# Patient Record
Sex: Male | Born: 1995 | Race: Black or African American | Hispanic: No | Marital: Single | Smoking: Never smoker
Health system: Southern US, Community
[De-identification: ages and names within clinical notes are randomized; demographics above are authoritative.]

## PROBLEM LIST (undated history)

## (undated) DIAGNOSIS — R32 Unspecified urinary incontinence: Secondary | ICD-10-CM

## (undated) DIAGNOSIS — J302 Other seasonal allergic rhinitis: Secondary | ICD-10-CM

## (undated) HISTORY — DX: Morbid (severe) obesity due to excess calories: E66.01

## (undated) HISTORY — DX: Unspecified urinary incontinence: R32

---
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
## 2019-01-28 ENCOUNTER — Emergency Department (HOSPITAL_COMMUNITY)
Admission: EM | Admit: 2019-01-28 | Discharge: 2019-01-28 | Disposition: A | Discharge status: Home / Self Care | Payer: 59 | Attending: Emergency Medicine | Admitting: Emergency Medicine

## 2019-01-28 ENCOUNTER — Encounter (HOSPITAL_COMMUNITY): Payer: Self-pay | Admitting: Emergency Medicine

## 2019-01-28 ENCOUNTER — Emergency Department (HOSPITAL_COMMUNITY): Payer: 59

## 2019-01-28 ENCOUNTER — Other Ambulatory Visit: Payer: Self-pay

## 2019-01-28 DIAGNOSIS — M791 Myalgia, unspecified site: Secondary | ICD-10-CM | POA: Insufficient documentation

## 2019-01-28 DIAGNOSIS — J029 Acute pharyngitis, unspecified: Secondary | ICD-10-CM | POA: Insufficient documentation

## 2019-01-28 DIAGNOSIS — Z20828 Contact with and (suspected) exposure to other viral communicable diseases: Secondary | ICD-10-CM | POA: Insufficient documentation

## 2019-01-28 DIAGNOSIS — R509 Fever, unspecified: Secondary | ICD-10-CM | POA: Diagnosis not present

## 2019-01-28 DIAGNOSIS — R05 Cough: Secondary | ICD-10-CM | POA: Diagnosis present

## 2019-01-28 DIAGNOSIS — Z20822 Contact with and (suspected) exposure to covid-19: Secondary | ICD-10-CM

## 2019-01-28 LAB — CBC WITH DIFFERENTIAL/PLATELET
Abs Immature Granulocytes: 0.02 10*3/uL (ref 0.00–0.07)
Basophils Absolute: 0 10*3/uL (ref 0.0–0.1)
Basophils Relative: 0 %
Eosinophils Absolute: 0.1 10*3/uL (ref 0.0–0.5)
Eosinophils Relative: 1 %
HCT: 43.7 % (ref 39.0–52.0)
Hemoglobin: 14.3 g/dL (ref 13.0–17.0)
Immature Granulocytes: 0 %
Lymphocytes Relative: 27 %
Lymphs Abs: 1.4 10*3/uL (ref 0.7–4.0)
MCH: 29.4 pg (ref 26.0–34.0)
MCHC: 32.7 g/dL (ref 30.0–36.0)
MCV: 89.9 fL (ref 80.0–100.0)
Monocytes Absolute: 0.6 10*3/uL (ref 0.1–1.0)
Monocytes Relative: 11 %
Neutro Abs: 3.3 10*3/uL (ref 1.7–7.7)
Neutrophils Relative %: 61 %
Platelets: 254 10*3/uL (ref 150–400)
RBC: 4.86 MIL/uL (ref 4.22–5.81)
RDW: 12.7 % (ref 11.5–15.5)
WBC: 5.4 10*3/uL (ref 4.0–10.5)
nRBC: 0 % (ref 0.0–0.2)

## 2019-01-28 LAB — BASIC METABOLIC PANEL
Anion gap: 9 (ref 5–15)
BUN: 13 mg/dL (ref 6–20)
CO2: 25 mmol/L (ref 22–32)
Calcium: 9.3 mg/dL (ref 8.9–10.3)
Chloride: 104 mmol/L (ref 98–111)
Creatinine, Ser: 0.87 mg/dL (ref 0.61–1.24)
GFR calc Af Amer: >60 mL/min (ref >60)
GFR calc non Af Amer: >60 mL/min (ref >60)
Glucose, Bld: 113 mg/dL — ABNORMAL HIGH (ref 70–99)
Potassium: 4.2 mmol/L (ref 3.5–5.1)
Sodium: 138 mmol/L (ref 135–145)

## 2019-01-28 MED ORDER — ACETAMINOPHEN 500 MG PO TABS
1000.0000 mg | ORAL_TABLET | Freq: Once | ORAL | Status: AC
  Administered 2019-01-28: 18:00:00 1000 mg via ORAL
  Filled 2019-01-28: qty 2

## 2019-01-28 NOTE — ED Notes (Signed)
Spoke with pt's mother on video chat and updated on plan of care

## 2019-01-28 NOTE — ED Notes (Signed)
Patient verbalizes understanding of discharge instructions. Opportunity for questioning and answers were provided. Armband removed by staff, pt discharged from ED ambulatory.   

## 2019-01-28 NOTE — Discharge Instructions (Addendum)
Self quarantine until your coronavirus test results are back.  If you test positive you need to quarantine for 14 days.  Please buy a pulse ox over-the-counter and check your oxygen.  If you have increased shortness of breath or your oxygen drops below 90% or if you have any concerns please return to the ER.  Take Tylenol as needed for fever body aches.  Drink plenty of fluids and rest.

## 2019-01-28 NOTE — ED Triage Notes (Signed)
Pt reports that he began having sneezing, cough, and fatigue that started Friday, and today pt developed a fever of 101 at home. He reports doing a virtual visit with a [place] provider who recommended testing. Pt in NAD at this time.

## 2019-01-28 NOTE — ED Provider Notes (Signed)
[facility] EMERGENCY DEPARTMENT Provider Note   CSN: [id number] Arrival date & time: 01/28/19  1613     History   Chief Complaint No chief complaint on file.   HPI Maanav Kassabian is a 23 y.o. male.     HPI   23 year old male, no significant medical history, presents with a 1 day history of cough, fever, body aches.  He notes a mild sore throat and rhinorrhea.  He denies any nausea, vomiting, abdominal pain.  He states a history of strep but states pain today does not feel the same as strep.  Patient denies any known sick contacts, exposure to coronavirus.  Past Medical History:  Diagnosis Date  . Enuresis   . Morbid obesity ([place])   . Seasonal allergies     Patient Active Problem List   Diagnosis Date Noted  . Morbid obesity ([place])     History reviewed. No pertinent surgical history.      Home Medications    Prior to Admission medications   Medication Sig Start Date End Date Taking? Authorizing Provider  amoxicillin (AMOXIL) 875 MG tablet Take 1 tablet (875 mg total) by mouth 2 (two) times daily. 05/24/15   Alycia Rossetti, MD  cetirizine (ZYRTEC) 10 MG tablet Take 10 mg by mouth daily.    [provider]  fluticasone (FLONASE) 50 MCG/ACT nasal spray Place 2 sprays into both nostrils daily. 05/24/15   Alycia Rossetti, MD    Family History Family History  Problem Relation Age of Onset  . Hypertension Mother   . Sleep apnea Mother   . Diabetes Maternal Grandmother   . Diabetes Maternal Grandfather     Social History Social History   Tobacco Use  . Smoking status: Never Smoker  . Smokeless tobacco: Never Used  Substance Use Topics  . Alcohol use: No    Alcohol/week: 0.0 standard drinks  . Drug use: No     Allergies   Patient has no known allergies.   Review of Systems Review of Systems  Constitutional: Positive for fever. Negative for chills.  HENT: Positive for rhinorrhea, sneezing and sore throat.   Respiratory:  Positive for cough. Negative for shortness of breath.   Cardiovascular: Negative for chest pain.  Gastrointestinal: Negative for abdominal pain, nausea and vomiting.     Physical Exam Updated Vital Signs BP 131/62 (BP Location: Left Arm)   Pulse (!) 125   Temp 100.3 F (37.9 C) (Oral)   Resp 16   SpO2 98%   Physical Exam Vitals signs and nursing note reviewed.  Constitutional:      Appearance: He is well-developed.  HENT:     Head: Normocephalic and atraumatic.     Mouth/Throat:     Lips: Pink.     Mouth: Mucous membranes are moist. No oral lesions.     Tongue: No lesions.     Palate: No mass.     Pharynx: Posterior oropharyngeal erythema (mild) present. No pharyngeal swelling or oropharyngeal exudate.  Eyes:     Conjunctiva/sclera: Conjunctivae normal.  Neck:     Musculoskeletal: Neck supple.  Cardiovascular:     Rate and Rhythm: Normal rate and regular rhythm.     Heart sounds: Normal heart sounds. No murmur.  Pulmonary:     Effort: Pulmonary effort is normal. No respiratory distress.     Breath sounds: Normal breath sounds. No wheezing or rales.  Abdominal:     General: Bowel sounds are normal. There is no distension.  Palpations: Abdomen is soft.     Tenderness: There is no abdominal tenderness.  Musculoskeletal: Normal range of motion.        General: No tenderness or deformity.  Skin:    General: Skin is warm and dry.     Findings: No erythema or rash.  Neurological:     Mental Status: He is alert and oriented to person, place, and time.  Psychiatric:        Behavior: Behavior normal.      ED Treatments / Results  Labs (all labs ordered are listed, but only abnormal results are displayed) Labs Reviewed  BASIC METABOLIC PANEL - Abnormal; Notable for the following components:      Result Value   Glucose, Bld 113 (*)    All other components within normal limits  NOVEL CORONAVIRUS, NAA (HOSPITAL ORDER, SEND-OUT TO REF LAB)  CULTURE, GROUP A STREP  ([facility])  CBC WITH DIFFERENTIAL/PLATELET    EKG None  Radiology Dg Chest Portable 1 View  Result Date: 01/28/2019 CLINICAL DATA:  Fever, chills and cough for 2 days. EXAM: PORTABLE CHEST 1 VIEW COMPARISON:  PA and lateral chest 07/06/2013. FINDINGS: Lungs clear. Heart size normal. No pneumothorax or pleural fluid. No bony abnormality. IMPRESSION: Normal chest. Electronically Signed   By: Drusilla Kannerhomas  Dalessio M.D.   On: 01/28/2019 18:12    Procedures Procedures (including critical care time)  Medications Ordered in ED Medications  acetaminophen (TYLENOL) tablet 1,000 mg (1,000 mg Oral Given 01/28/19 1744)     Initial Impression / Assessment and Plan / ED Course  I have reviewed the triage vital signs and the nursing notes.  Pertinent labs & imaging results that were available during my care of the patient were reviewed by me and considered in my medical decision making (see chart for details).        Patient presents with cough, sore throat, fever and myalgias.  On arrival he is febrile and tachycardic.  Patient given Tylenol.  Physical exam notable for mild erythema of the posterior pharynx, no evidence of peritonsillar retropharyngeal abscess.  Patient talking in full sentences, no respiratory distress, increased work of breathing.  Lungs are clear to auscultation throughout.  Throat culture and coronavirus swab sent.  X-ray clear.  Blood work obtained in triage with no acute abnormalities.  Patient's oxygen is 97% or greater.  He is stable for discharge.  Given strict return cautions and instructions on coronavirus.  Ericka Pontiffustin Szwed was evaluated in Emergency Department on 01/28/2019 for the symptoms described in the history of present illness. He was evaluated in the context of the global COVID-19 pandemic, which necessitated consideration that the patient might be at risk for infection with the SARS-CoV-2 virus that causes COVID-19. Institutional protocols and algorithms that pertain to  the evaluation of patients at risk for COVID-19 are in a state of rapid change based on information released by regulatory bodies including the CDC and federal and state organizations. These policies and algorithms were followed during the patient's care in the ED.   Final Clinical Impressions(s) / ED Diagnoses   Final diagnoses:  None    ED Discharge Orders    None       Rueben BashKendrick, Jacklynn Dehaas S, PA-C 01/28/19 2251    Margarita Grizzleay, Danielle, MD 02/03/19 1350

## 2019-01-31 ENCOUNTER — Telehealth (HOSPITAL_COMMUNITY): Payer: Self-pay

## 2019-01-31 LAB — CULTURE, GROUP A STREP (THRC)

## 2019-02-01 LAB — NOVEL CORONAVIRUS, NAA (HOSP ORDER, SEND-OUT TO REF LAB; TAT 18-24 HRS): SARS-CoV-2, NAA: NOT DETECTED

## 2020-01-11 ENCOUNTER — Other Ambulatory Visit: Payer: Self-pay

## 2020-01-11 ENCOUNTER — Encounter (INDEPENDENT_AMBULATORY_CARE_PROVIDER_SITE_OTHER): Payer: Self-pay | Admitting: Otolaryngology

## 2020-01-11 ENCOUNTER — Ambulatory Visit (INDEPENDENT_AMBULATORY_CARE_PROVIDER_SITE_OTHER): Payer: 59 | Admitting: Otolaryngology

## 2020-01-11 VITALS — Temp 97.5°F

## 2020-01-11 DIAGNOSIS — J351 Hypertrophy of tonsils: Secondary | ICD-10-CM | POA: Diagnosis not present

## 2020-01-11 DIAGNOSIS — J0391 Acute recurrent tonsillitis, unspecified: Secondary | ICD-10-CM

## 2020-01-11 NOTE — Progress Notes (Signed)
HPI: Benjamin Leon is a 24 y.o. male who presents for evaluation of recurrent streptococcal tonsillitis.  Patient estimates he has had 10 or 12 episodes over the past several years.  He presents today to discuss possible tonsillectomy. He is otherwise healthy and on no medications. NKDA. Takes occasional medications for allergies.  Past Medical History:  Diagnosis Date  . Enuresis   . Morbid obesity (HCC)   . Seasonal allergies    No past surgical history on file. Social History   Socioeconomic History  . Marital status: Single    Spouse name: Not on file  . Number of children: Not on file  . Years of education: Not on file  . Highest education level: Not on file  Occupational History  . Not on file  Tobacco Use  . Smoking status: Never Smoker  . Smokeless tobacco: Never Used  Substance and Sexual Activity  . Alcohol use: No    Alcohol/week: 0.0 standard drinks  . Drug use: No  . Sexual activity: Never    Comment: attends [employer].  Other Topics Concern  . Not on file  Social History Narrative  . Not on file   Social Determinants of Health   Financial Resource Strain:   . Difficulty of Paying Living Expenses:   Food Insecurity:   . Worried About Programme researcher, broadcasting/film/video in the Last Year:   . Barista in the Last Year:   Transportation Needs:   . Freight forwarder (Medical):   Marland Kitchen Lack of Transportation (Non-Medical):   Physical Activity:   . Days of Exercise per Week:   . Minutes of Exercise per Session:   Stress:   . Feeling of Stress :   Social Connections:   . Frequency of Communication with Friends and Family:   . Frequency of Social Gatherings with Friends and Family:   . Attends Religious Services:   . Active Member of Clubs or Organizations:   . Attends Banker Meetings:   Marland Kitchen Marital Status:    Family History  Problem Relation Age of Onset  . Hypertension Mother   . Sleep apnea Mother   . Diabetes Maternal  Grandmother   . Diabetes Maternal Grandfather    No Known Allergies Prior to Admission medications   Medication Sig Start Date End Date Taking? Authorizing Provider  amoxicillin (AMOXIL) 875 MG tablet Take 1 tablet (875 mg total) by mouth 2 (two) times daily. 05/24/15   Salley Scarlet, MD  cetirizine (ZYRTEC) 10 MG tablet Take 10 mg by mouth daily.    [provider]  fluticasone (FLONASE) 50 MCG/ACT nasal spray Place 2 sprays into both nostrils daily. 05/24/15   Salley Scarlet, MD     Positive ROS: Otherwise negative  All other systems have been reviewed and were otherwise negative with the exception of those mentioned in the HPI and as above.  Physical Exam: Constitutional: Alert, well-appearing, no acute distress.  Patient is obese. Ears: External ears without lesions or tenderness. Ear canals are clear bilaterally with intact, clear TMs.  Nasal: External nose without lesions. Septum midline with mild rhinitis. Clear nasal passages otherwise. Oral: Lips and gums without lesions. Tongue and palate mucosa without lesions. Posterior oropharynx clear.  Patient has large 3+ tonsils bilaterally with crypts but no acute exudate. Neck: No palpable adenopathy or masses Respiratory: Breathing comfortably.  Lungs are clear to auscultation Cardiac exam: Regular rate and rhythm without murmur. Skin: No facial/neck  lesions or rash noted.  Procedures  Assessment: History of recurrent tonsillitis with tonsil hypertrophy.  Plan: Discussed tonsillectomy as well as the morbidity associated with tonsillectomy with the patient.  He will discuss with our nurse concerning possibly scheduling tonsillectomy. He will have to be out of work for 2 weeks following surgery.  Radene Journey, MD

## 2020-06-21 ENCOUNTER — Ambulatory Visit: Payer: 59

## 2021-01-11 IMAGING — DX PORTABLE CHEST - 1 VIEW
1 series · 1 of 1 positions shown · non-contrast
Comparison: PA and lateral chest 07/06/2013.

CLINICAL DATA: Fever, chills and cough for 2 days.

EXAM:
PORTABLE CHEST 1 VIEW

[chest ap]
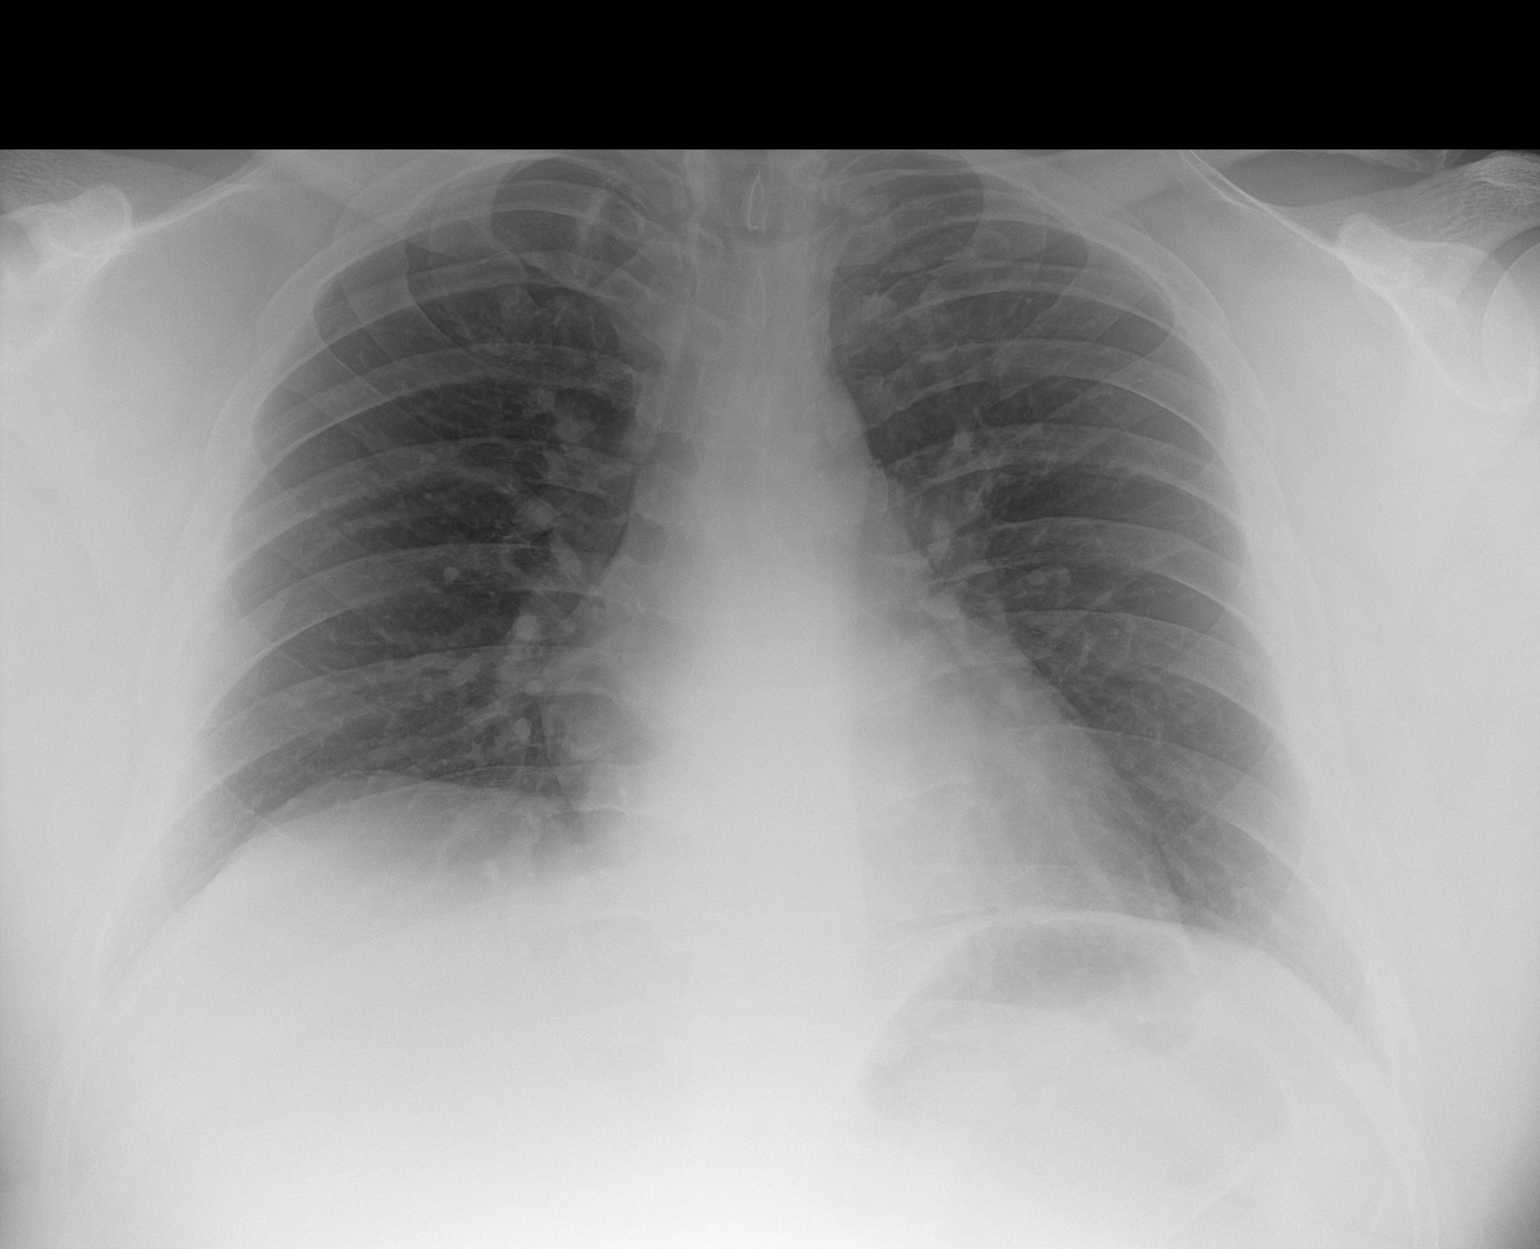

[1 of 1 positions shown; findings below may reference images not displayed]

FINDINGS: Lungs clear. Heart size normal. No pneumothorax or pleural fluid. No
bony abnormality.
IMPRESSION: Normal chest.
